# Patient Record
Sex: Male | Born: 1980 | Race: Black or African American | Hispanic: No | Marital: Married | Smoking: Current some day smoker
Health system: Southern US, Community
[De-identification: ages and names within clinical notes are randomized; demographics above are authoritative.]

## PROBLEM LIST (undated history)

## (undated) DIAGNOSIS — I319 Disease of pericardium, unspecified: Secondary | ICD-10-CM

## (undated) DIAGNOSIS — R011 Cardiac murmur, unspecified: Secondary | ICD-10-CM

## (undated) HISTORY — DX: Cardiac murmur, unspecified: R01.1

---
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
## 2001-01-25 ENCOUNTER — Emergency Department (HOSPITAL_COMMUNITY): Admission: EM | Admit: 2001-01-25 | Discharge: 2001-01-25 | Payer: Self-pay | Admitting: Emergency Medicine

## 2001-07-10 ENCOUNTER — Encounter: Payer: Self-pay | Admitting: Orthopaedic Surgery

## 2001-07-10 ENCOUNTER — Ambulatory Visit (HOSPITAL_COMMUNITY): Admission: RE | Admit: 2001-07-10 | Discharge: 2001-07-10 | Payer: Self-pay | Admitting: Orthopaedic Surgery

## 2002-01-22 ENCOUNTER — Encounter: Payer: Self-pay | Admitting: Neurological Surgery

## 2002-01-22 ENCOUNTER — Encounter: Admission: RE | Admit: 2002-01-22 | Discharge: 2002-01-22 | Payer: Self-pay | Admitting: Neurological Surgery

## 2002-02-14 ENCOUNTER — Encounter: Admission: RE | Admit: 2002-02-14 | Discharge: 2002-02-14 | Payer: Self-pay | Admitting: Neurological Surgery

## 2002-02-14 ENCOUNTER — Encounter: Payer: Self-pay | Admitting: Neurological Surgery

## 2004-01-13 ENCOUNTER — Emergency Department (HOSPITAL_COMMUNITY): Admission: EM | Admit: 2004-01-13 | Discharge: 2004-01-13 | Payer: Self-pay | Admitting: Family Medicine

## 2004-07-23 ENCOUNTER — Ambulatory Visit: Payer: Self-pay | Admitting: Pulmonary Disease

## 2005-06-20 HISTORY — PX: KNEE LIGAMENT RECONSTRUCTION: SHX1895

## 2005-08-28 ENCOUNTER — Emergency Department (HOSPITAL_COMMUNITY): Admission: EM | Admit: 2005-08-28 | Discharge: 2005-08-28 | Payer: Self-pay | Admitting: Emergency Medicine

## 2005-08-30 ENCOUNTER — Emergency Department (HOSPITAL_COMMUNITY): Admission: EM | Admit: 2005-08-30 | Discharge: 2005-08-30 | Payer: Self-pay | Admitting: Emergency Medicine

## 2005-10-27 ENCOUNTER — Ambulatory Visit (HOSPITAL_BASED_OUTPATIENT_CLINIC_OR_DEPARTMENT_OTHER): Admission: RE | Admit: 2005-10-27 | Discharge: 2005-10-28 | Payer: Self-pay | Admitting: Specialist

## 2006-08-16 ENCOUNTER — Ambulatory Visit: Payer: Self-pay | Admitting: Pulmonary Disease

## 2006-08-17 IMAGING — CT CT ABDOMEN W/ CM
2 of 4 series · 14 of 32 positions shown, 19 images · IV contrast ([ID] OMNI 300)
Comparison: None.

CLINICAL DATA: Progressive right lower quadrant pain and fever for three days.  Question appendicitis. 
 ABDOMEN CT WITH CONTRAST:
TECHNIQUE: Multidetector CT imaging of the abdomen was performed following the standard protocol during bolus administration of intravenous contrast.
 Contrast:  100 cc Omnipaque 300.  Oral contrast was given.
TECHNIQUE: Multidetector CT imaging of the pelvis was performed following the standard protocol during bolus administration of intravenous contrast.

[Series 2: abd pelvis · axial · 0.74mm/px · z∈[-493,-138]mm · 7 of 95 slices shown, 12 images]
[im 12/95  soft-tissue]
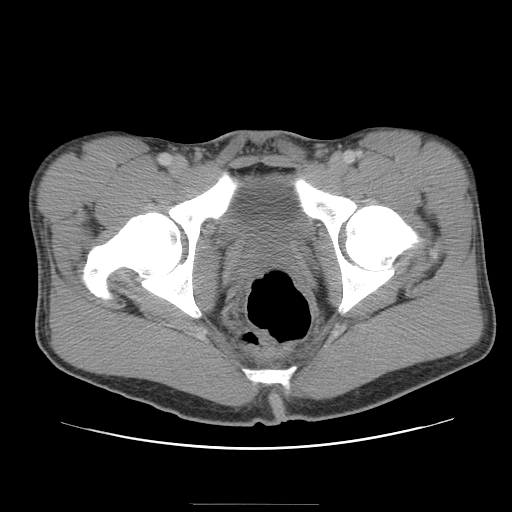
[im 12/95  bone]
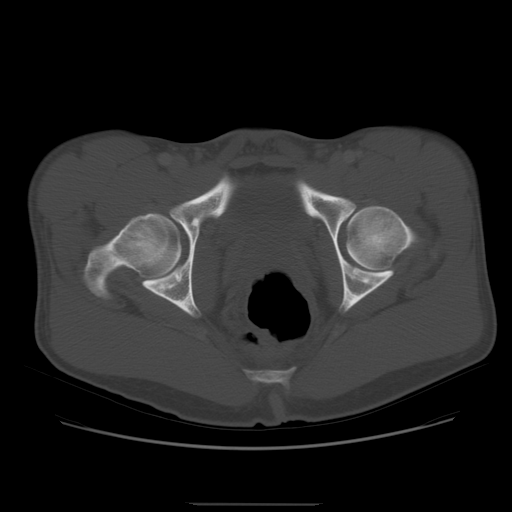
[im 24/95  soft-tissue]
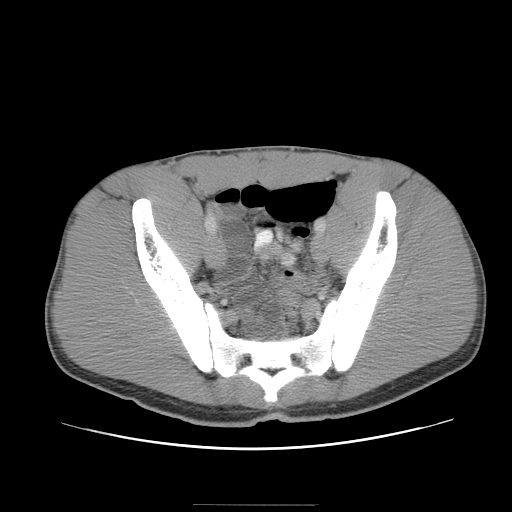
[im 36/95  soft-tissue]
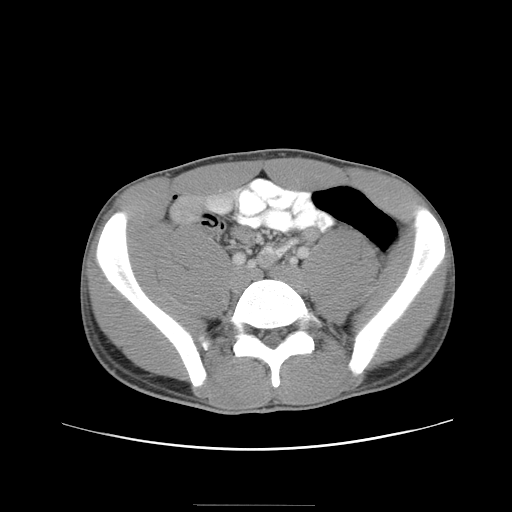
[im 48/95  soft-tissue]
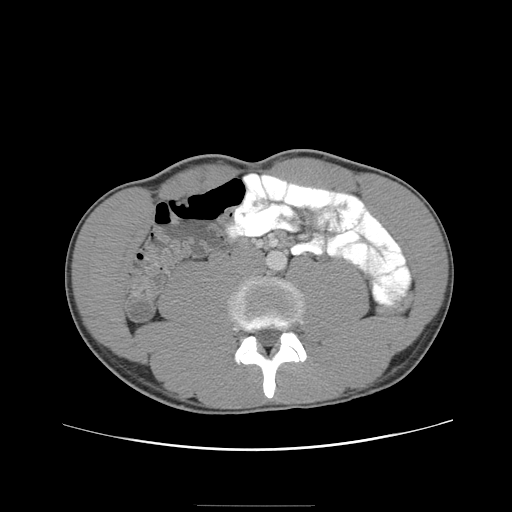
[im 48/95  lung]
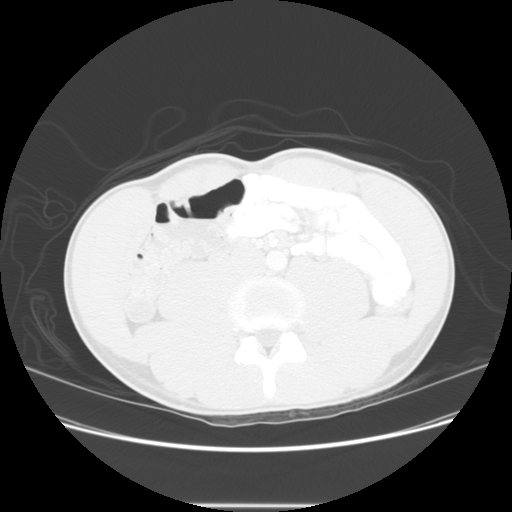
[im 59/95  soft-tissue]
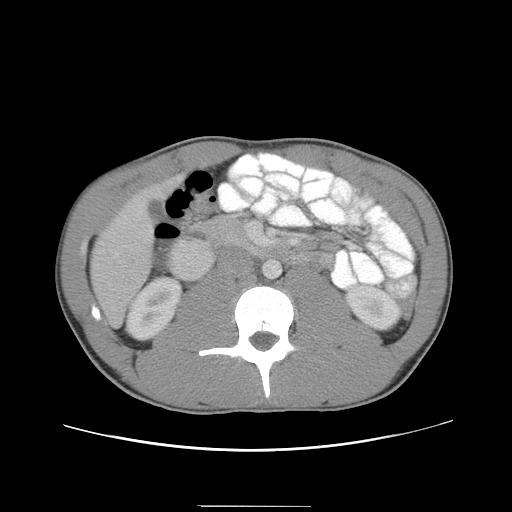
[im 59/95  lung]
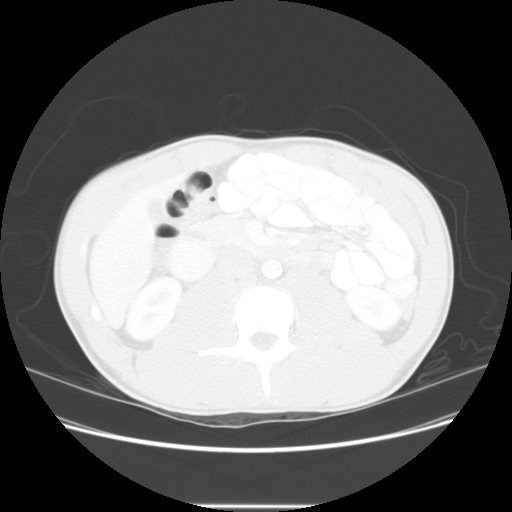
[im 71/95  soft-tissue]
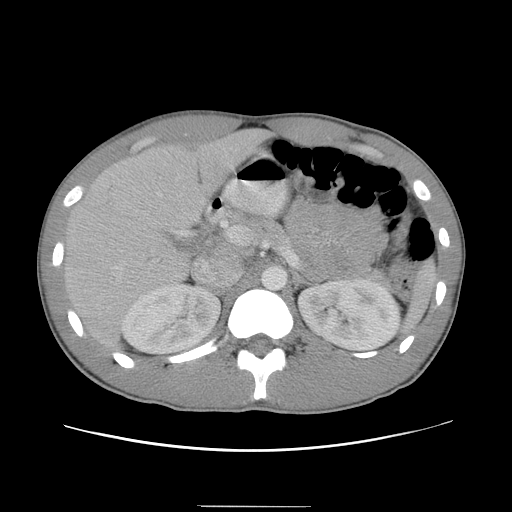
[im 71/95  lung]
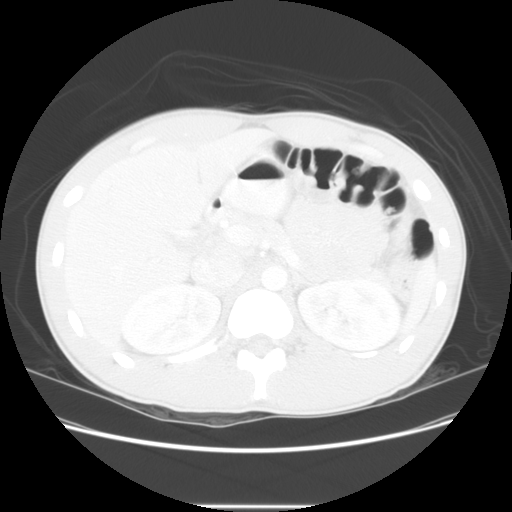
[im 83/95  soft-tissue]
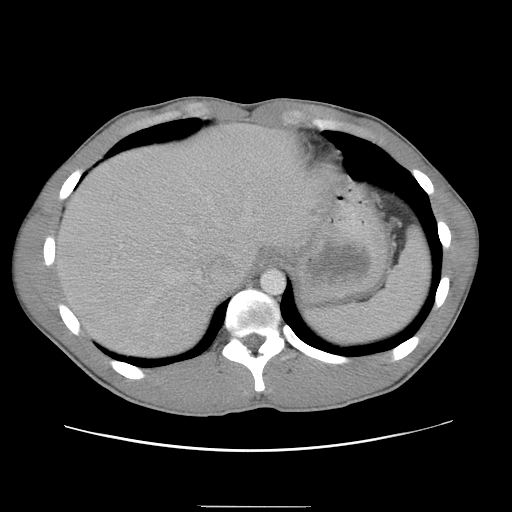
[im 83/95  lung]
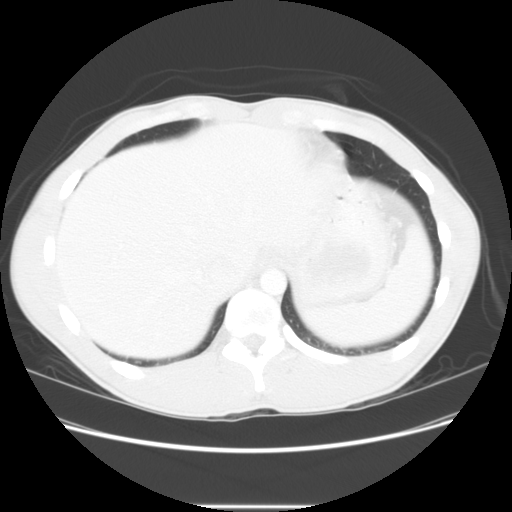

[Series 104: reformatted · sagittal · 0.74mm/px · 7 of 130 slices shown]
[im 13/130  soft-tissue]
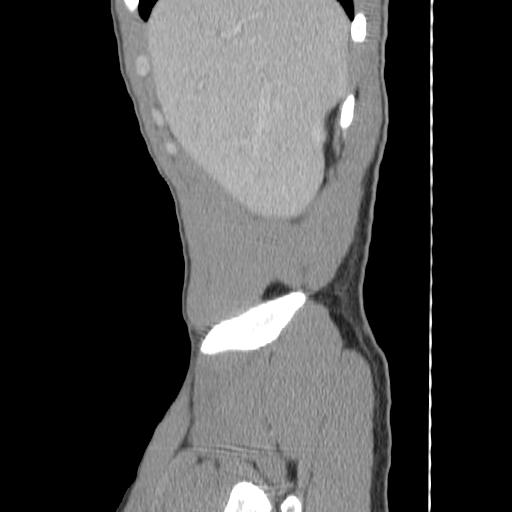
[im 26/130  soft-tissue]
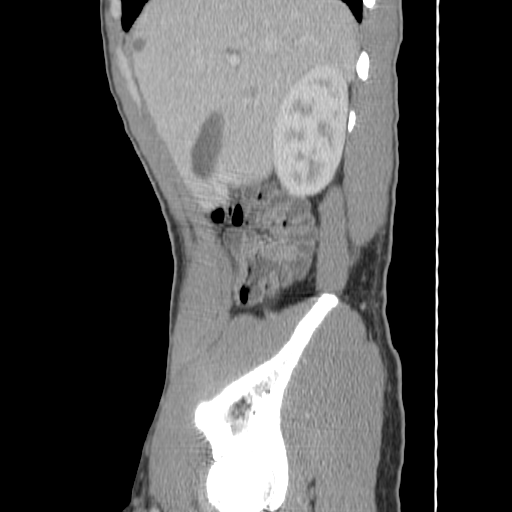
[im 39/130  soft-tissue]
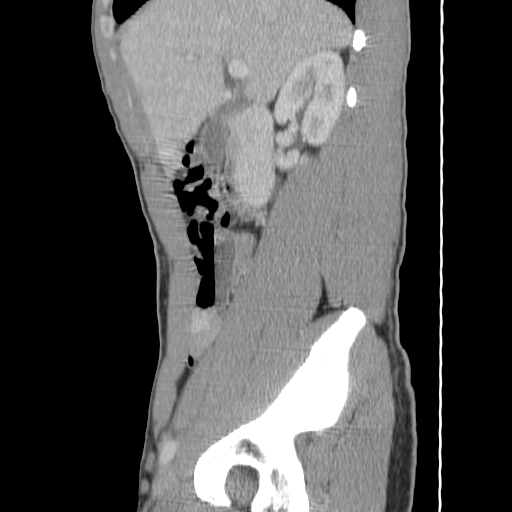
[im 52/130  soft-tissue]
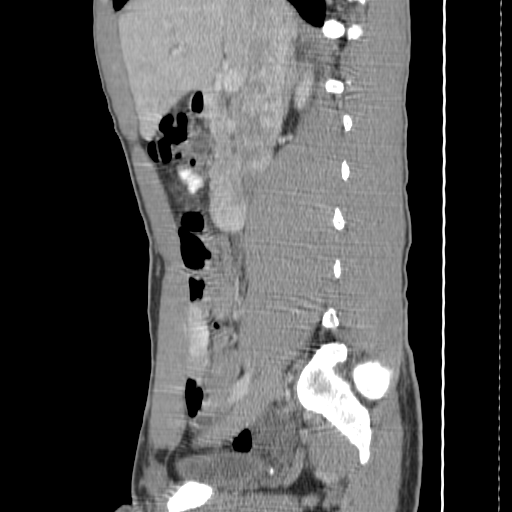
[im 78/130  soft-tissue]
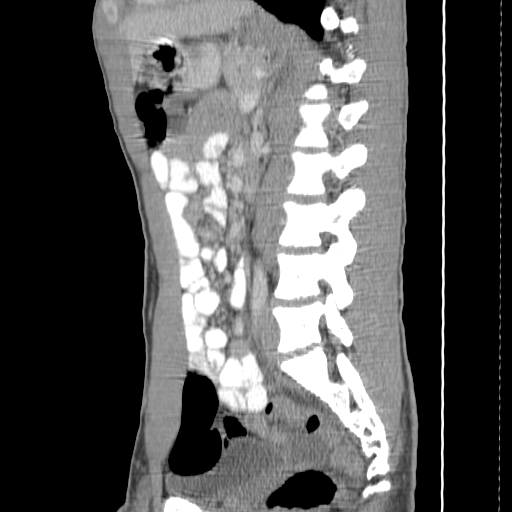
[im 91/130  soft-tissue]
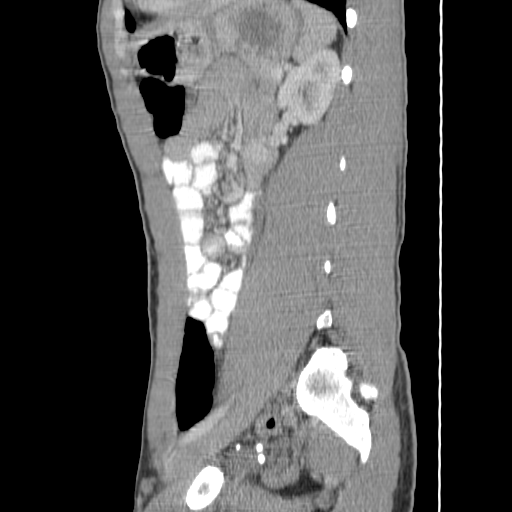
[im 104/130  soft-tissue]
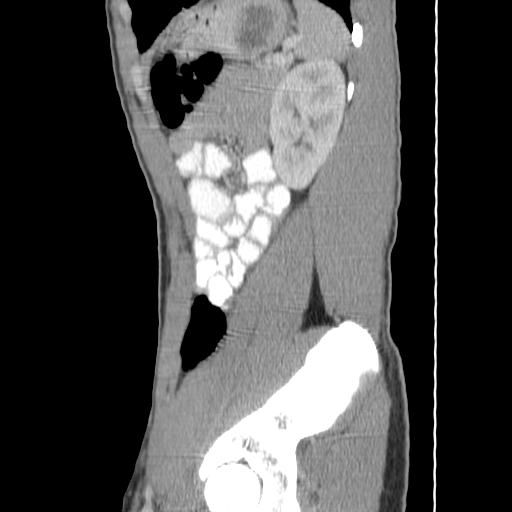

[14 of 32 positions shown; findings below may reference images not displayed]

FINDINGS: The lung bases are clear.  There is no pleural effusion.  A probable septated 1.9 x 0.8 cm cyst is noted anteriorly in the left hepatic lobe on image 17.  There is hepatic focal fat adjacent to the falciform ligament.  The liver otherwise appears normal.  The spleen, gallbladder, pancreas, adrenal glands, and kidneys appear normal.  No intraabdominal inflammatory changes are seen.
IMPRESSION: No acute abdominal findings.  Probable hepatic cyst.
 PELVIS CT WITH CONTRAST:
FINDINGS: The oral contrast has not yet filled the right colon.  However, a normal caliber appendix is visualized on image 58 through 61 without surrounding inflammation.  The appendiceal lumen is filled with air.  No pelvic inflammatory changes are identified.  There are bilateral pelvic phleboliths.
IMPRESSION: No CT evidence of acute appendicitis or other acute pelvic process.

## 2011-09-30 ENCOUNTER — Emergency Department (INDEPENDENT_AMBULATORY_CARE_PROVIDER_SITE_OTHER): Payer: 59

## 2011-09-30 ENCOUNTER — Encounter (HOSPITAL_BASED_OUTPATIENT_CLINIC_OR_DEPARTMENT_OTHER): Payer: Self-pay | Admitting: Family Medicine

## 2011-09-30 ENCOUNTER — Emergency Department (HOSPITAL_BASED_OUTPATIENT_CLINIC_OR_DEPARTMENT_OTHER)
Admission: EM | Admit: 2011-09-30 | Discharge: 2011-09-30 | Disposition: A | Discharge status: Home / Self Care | Payer: 59 | Attending: Emergency Medicine | Admitting: Emergency Medicine

## 2011-09-30 DIAGNOSIS — I319 Disease of pericardium, unspecified: Secondary | ICD-10-CM

## 2011-09-30 DIAGNOSIS — J45909 Unspecified asthma, uncomplicated: Secondary | ICD-10-CM | POA: Insufficient documentation

## 2011-09-30 DIAGNOSIS — R079 Chest pain, unspecified: Secondary | ICD-10-CM

## 2011-09-30 DIAGNOSIS — F17200 Nicotine dependence, unspecified, uncomplicated: Secondary | ICD-10-CM | POA: Insufficient documentation

## 2011-09-30 LAB — RAPID URINE DRUG SCREEN, HOSP PERFORMED
Barbiturates: NOT DETECTED
Benzodiazepines: NOT DETECTED
Cocaine: NOT DETECTED
Opiates: NOT DETECTED
Tetrahydrocannabinol: POSITIVE — AB

## 2011-09-30 LAB — DIFFERENTIAL
Basophils Absolute: 0 10*3/uL (ref 0.0–0.1)
Lymphocytes Relative: 21 % (ref 12–46)
Lymphs Abs: 2.4 10*3/uL (ref 0.7–4.0)
Neutro Abs: 7.6 10*3/uL (ref 1.7–7.7)
Neutrophils Relative %: 67 % (ref 43–77)

## 2011-09-30 LAB — CBC
Platelets: 311 10*3/uL (ref 150–400)
RBC: 4.82 MIL/uL (ref 4.22–5.81)
RDW: 12.5 % (ref 11.5–15.5)
WBC: 11.4 10*3/uL — ABNORMAL HIGH (ref 4.0–10.5)

## 2011-09-30 LAB — BASIC METABOLIC PANEL
CO2: 26 mEq/L (ref 19–32)
Calcium: 9.7 mg/dL (ref 8.4–10.5)
Glucose, Bld: 118 mg/dL — ABNORMAL HIGH (ref 70–99)
Potassium: 3.7 mEq/L (ref 3.5–5.1)
Sodium: 138 mEq/L (ref 135–145)

## 2011-09-30 MED ORDER — IBUPROFEN 800 MG PO TABS
800.0000 mg | ORAL_TABLET | Freq: Once | ORAL | Status: AC
  Administered 2011-09-30: 800 mg via ORAL
  Filled 2011-09-30: qty 1

## 2011-09-30 MED ORDER — IBUPROFEN 800 MG PO TABS: 800.0000 mg | ORAL_TABLET | Freq: Three times a day (TID) | ORAL | Status: AC

## 2011-09-30 MED ORDER — GI COCKTAIL ~~LOC~~
30.0000 mL | Freq: Once | ORAL | Status: AC
  Administered 2011-09-30: 30 mL via ORAL
  Filled 2011-09-30: qty 30

## 2011-09-30 NOTE — ED Provider Notes (Signed)
History     CSN: [id number]  Arrival date & time 09/30/11  0847   First MD Initiated Contact with Patient 09/30/11 [phone]      Chief Complaint  Patient presents with  . Chest Pain    (Consider location/radiation/quality/duration/timing/severity/associated sxs/prior treatment) HPI Comments: Patient presents with left-sided chest tightness that has been constant since about 12 AM. Not had this pain in the past. Trauma. Pain in his left chest wall, worse with palpation and worse with movement. Associated with some shortness of breath. He denies any nausea, vomiting, abdominal pain, back pain. He denies any cardiac history. He took some Gas-X without relief. The pain does not radiate and is persistent moderate in intensity. Denies any cocaine abuse.  The history is provided by the patient and a relative.    Past Medical History  Diagnosis Date  . Asthma     Past Surgical History  Procedure Date  . Knee arthroscopy     No family history on file.  History  Substance Use Topics  . Smoking status: Current Some Day Smoker  . Smokeless tobacco: Not on file  . Alcohol Use: Yes      Review of Systems  Constitutional: Negative for fever, activity change and appetite change.  HENT: Negative for congestion and rhinorrhea.   Respiratory: Positive for chest tightness and shortness of breath. Negative for cough.   Cardiovascular: Positive for chest pain.  Gastrointestinal: Negative for nausea, vomiting and abdominal pain.  Genitourinary: Negative for dysuria and hematuria.  Musculoskeletal: Negative for back pain.  Neurological: Negative for dizziness, weakness and headaches.    Allergies  Review of patient's allergies indicates no known allergies.  Home Medications   Current Outpatient Rx  Name Route Sig Dispense Refill  . IBUPROFEN 800 MG PO TABS Oral Take 1 tablet (800 mg total) by mouth 3 (three) times daily. 21 tablet 0    BP 137/78  Pulse 68  Temp(Src) 97.8 F (36.6  C) (Oral)  Resp 16  Ht 6' (1.829 m)  Wt 215 lb (97.523 kg)  BMI 29.16 kg/m2  SpO2 100%  Physical Exam  Constitutional: He is oriented to person, place, and time. He appears well-developed and well-nourished. No distress.  HENT:  Head: Normocephalic and atraumatic.  Mouth/Throat: Oropharynx is clear and moist. No oropharyngeal exudate.  Eyes: Conjunctivae are normal. Pupils are equal, round, and reactive to light.  Neck: Normal range of motion. Neck supple.  Cardiovascular: Normal rate, regular rhythm and normal heart sounds.   No murmur heard. Pulmonary/Chest: Effort normal and breath sounds normal. No respiratory distress. He exhibits tenderness.       Left-sided chest wall tenderness, no rash  Abdominal: Soft. There is no tenderness. There is no rebound and no guarding.  Musculoskeletal: Normal range of motion. He exhibits no edema and no tenderness.  Neurological: He is alert and oriented to person, place, and time. No cranial nerve deficit.  Skin: Skin is warm.    ED Course  Procedures (including critical care time)  Labs Reviewed  CBC - Abnormal; Notable for the following:    WBC 11.4 (*)    MCHC 36.1 (*)    All other components within normal limits  DIFFERENTIAL - Abnormal; Notable for the following:    Eosinophils Relative 6 (*)    All other components within normal limits  BASIC METABOLIC PANEL - Abnormal; Notable for the following:    Glucose, Bld 118 (*)    GFR calc non Af Amer 89 (*)  All other components within normal limits  URINE RAPID DRUG SCREEN (HOSP PERFORMED) - Abnormal; Notable for the following:    Tetrahydrocannabinol POSITIVE (*) REPEATED TO VERIFY   All other components within normal limits  TROPONIN I   Dg Chest 2 View  09/30/2011  *RADIOLOGY REPORT*  Clinical Data: Chest pain.  CHEST - 2 VIEW  Comparison: None  Findings: Heart and mediastinal contours are within normal limits. No focal opacities or effusions.  No acute bony abnormality.   IMPRESSION: No active cardiopulmonary disease.  Original Report Authenticated By: Cyndie Chime, M.D.     1. Pericarditis       MDM  Constant chest wall pain since 12 AM. Worse with movement. Vital signs stable, no distress. Very low suspicion for ACS.  Cocaine screen negative. Chest x-ray negative. Treating chest wall pain and pericarditis with anti-inflammatories.   Date: 09/30/2011  Rate: 63  Rhythm: normal sinus rhythm  QRS Axis: normal  Intervals: normal  ST/T Wave abnormalities: early repolarization  Conduction Disutrbances:none  Narrative Interpretation: some PR depression, pericarditis changes  Old EKG Reviewed: none available          Glynn Octave, MD 09/30/11 1034

## 2011-09-30 NOTE — Discharge Instructions (Signed)
Pericarditis Pericarditis is an inflammation of the sac that surrounds the heart. This sac is known as the pericardium. Typically the pericardium contains a smooth lubricating lining. When you have pericarditis, the lining is more like sandpaper. CAUSES   Viral or bacterial infections.   Heart disease.   Heart surgery.   Reaction to a drug.   Kidney failure.   Thyroid problems.   Arthritis.   Cancer.  SYMPTOMS   Sharp chest pain under the breast bone (sternum).   Pain may also be felt in the neck, back or arms.   Pain may feel worse if you lean forward, take a deep breath, or lie down.   Shortness of breath.   Fever.   Pain on swallowing.  DIAGNOSIS  The diagnosis of pericarditis is based on your history,exam findings, and tests. These may include an EKG, chest x-ray, CT studies, blood tests, and echocardiogram.  TREATMENT  Treatment for pericarditis includes medicine for discomfort. An antibiotic may also be needed. With proper treatment, most episodes of pericarditis get better without complications. HOME CARE INSTRUCTIONS   Get plenty of rest.   Avoid activities that increase your pain.   Do not smoke or drink alcohol.   Be sure to see your caregiver as recommended for follow-up to make sure your condition resolves completely.  SEEK IMMEDIATE MEDICAL CARE IF:  You develop pain or shortness of breath that is getting worse.   You develop a high fever and severe abdominal or back pain.   You develop marked weakness, fainting, or any other serious complaint.  Document Released: 07/14/2004 Document Revised: 05/26/2011 Document Reviewed: 09/03/2007 [facility] Patient Information 2012 [place], [place].

## 2011-09-30 NOTE — ED Notes (Signed)
Pt sts he woke up with chest tightness at 2am. Pt sts movement such as riding in car makes symptom worse. Pt also c/o nausea last night and sts he "thinks it could be indigestion but not sure". Pt sts he had murmur as child and exercise induced asthma, denies other history.

## 2011-10-03 ENCOUNTER — Telehealth: Payer: Self-pay | Admitting: Pulmonary Disease

## 2011-10-03 NOTE — Telephone Encounter (Signed)
I spoke with the pt and he states he was seen in ER over the weekend and was instructed to f/u with Dr. Kriste Basque. Pt has not been seen in over 5 years, no record in centricity. I advised that after 3 years he is considered a new patient, and that Dr. Kriste Basque is not accepting new pt. Pt request I send message to ask if he will accept him back as patient. Please advise.Carron Curie, CMA

## 2011-10-03 NOTE — Telephone Encounter (Signed)
Called patient after Dr.Nadel's office called to let us know he needed a pcp.  The patient stated he needs a cardiologist and an antibiotic to fix his "heart problem".  He was advised the next new pt appt we had is May 28th with Dr.Jones.  He refused that apt stating it was not soon enough.  The patient was advised to call Dr.Nadel's office back to see if they could refer him to a cardiologist due to him stating he needed a cardiologist.

## 2011-10-03 NOTE — Telephone Encounter (Signed)
Per SN---ok to add pt to next aval appt.  Scheduled pt appt to see SN on 4-29 at 12.  Pt is aware.  Pt was advised to bring in any meds that he is currently taking.

## 2011-10-03 NOTE — Telephone Encounter (Signed)
Pt's mother , Pj Zehner [phone]), called in & stated she has been a pt of SN for quite a long time as well.  Pt is requesting to be seen by SN ASAP w/ a follow up appt.  Stated that pt has "air around his heart."  Please call pt back, but if necessary, Mrs. Bley can be contacted as well w/ any questions on pt's behalf.  Antionette Fairy

## 2011-10-03 NOTE — Telephone Encounter (Signed)
Pt called back requesting an appt w/ sn asap. Troy Erickson

## 2011-10-05 ENCOUNTER — Emergency Department (HOSPITAL_COMMUNITY)
Admission: EM | Admit: 2011-10-05 | Discharge: 2011-10-05 | Disposition: A | Discharge status: Home / Self Care | Payer: 59 | Source: Home / Self Care | Attending: Emergency Medicine | Admitting: Emergency Medicine

## 2011-10-05 ENCOUNTER — Encounter (HOSPITAL_COMMUNITY): Payer: Self-pay

## 2011-10-05 DIAGNOSIS — I309 Acute pericarditis, unspecified: Secondary | ICD-10-CM

## 2011-10-05 HISTORY — DX: Disease of pericardium, unspecified: I31.9

## 2011-10-05 MED ORDER — NAPROXEN 500 MG PO TABS: 500.0000 mg | ORAL_TABLET | Freq: Two times a day (BID) | ORAL | Status: AC

## 2011-10-05 MED ORDER — ALBUTEROL SULFATE HFA 108 (90 BASE) MCG/ACT IN AERS: 1.0000 | INHALATION_SPRAY | Freq: Four times a day (QID) | RESPIRATORY_TRACT | Status: AC | PRN

## 2011-10-05 NOTE — Discharge Instructions (Signed)
Pericarditis   Pericarditis is an inflammation (soreness or redness) of the sac (almost like a bag) surrounding the heart, starting at the large vessels at the top of the heart, shown in the picture, and wrapping down around the heart. The inside of this sac is very smooth so the heart can beat and slide easily within this protective membrane. The outside of the sac is a tougher layer of material. This sac may become inflamed by a number of different problems. Pericarditis is usually accompanied by pain in the chest which radiates to the shoulder, back, and upper and middle belly (abdomen). There may be shortness of breath and swelling of the abdomen. Sometimes fluid collects around the heart. When this happens the heart cannot beat as well. When your heart is unable to work as well, you will not feel well. You have may have shortness of breath (dyspnea) with exertion such as climbing stairs. The kidneys do not work as well so you may retain fluid. This is also one of the reasons your lower legs and ankles swell.   The first sign you will usually recognize is chest pain. You will also usually get a rapid heartbeat. You may have sudden unexplained weight gain of ten to fifteen pounds. You may get short of breath while sleeping. The heart actually has to work harder while you are lying down. This may also produce a night cough. It may help to sleep with two or more pillows.   With treatment these symptoms usually improve rapidly. Upon discharge from this location, weigh yourself after arriving home. Record your weight daily at the same time. This will give some indication as to your progress. As you get better your weight will usually go down. Follow a low sodium diet.   CAUSES   Medicine side effects.   Radiation as received for treatment of cancer.   Tumors or cancer themselves.   Infections. These may be caused by viruses, germs (bacteria) or fungi.   Tuberculosis (this is also an infection).   Autoimmune disorders  such as lupus, scleroderma, and rheumatoid arthritis.   DIAGNOSIS   The diagnosis of pericarditis is made by history (asking the patient what seems to be the problem) and physical exam (looking and listening to the patient by the caregiver). Blood tests may need to be done. Often specialized tests such as echocardiogram (pictures which are taken by bouncing sound waves off the heart), CT scans of the heart and chest, and perhaps other tests depending on what is happening, may be done.   TREATMENT   Treatment of pericarditis will usually include medicine to relieve pain.   If water retention is present, water reducing pills (diuretics) may be given to get rid of the water accumulation.   If the pericarditis is caused by an infection which can be treated, medicine which kill germs (antibiotics) will be started. If the infection is caused by a fungus or tuberculosis, treatment may be necessary for very long times.   If the infection is caused by a virus it will usually run its course and cause no further problems. When a virus is the cause, anti-inflammatory medicines (medicine against soreness) will often be given.   If complications occur from pericarditis, such as scarring of the pericardial sac following the inflammation, surgery is rarely needed to remove the sac. The sac surrounding the heart is not necessary for life. When it becomes scarred, it makes it more difficult for the heart to beat normally.     HOME CARE INSTRUCTIONS   Follow the treatment plan your caregiver prescribes. They can help you with your post hospital care program. Follow your caregiver's advice regarding dieting and exercise. Eat a low fat diet. Use alcohol only as directed. Take all medicines as directed.   Eat a nutritious diet low in fat and sodium.   Try to maintain an ideal weight. Your caregivers can help you with this.   Exercise as instructed.   Wear a medical alert bracelet if recommended by your caregiver.   Keep medicine with you,  including a list with dosages, in case of an emergency.   Quit smoking, if you smoke.   SEEK IMMEDIATE MEDICAL CARE IF:   Chest pain.   Vomiting.   Sweating (diaphoresis).   Irregular heartbeat (palpitations).   Racing heart.   Fainting episodes.   Feeling sick to your stomach (nausea).   Weakness.   If you develop any of the symptoms which originally made you seek care, call for local emergency medical help. Do not drive yourself to the hospital.   Document Released: 11/30/2000 Document Revised: 05/26/2011 Document Reviewed: 06/08/2011   [employer]® Patient Information ©2012 [employer].

## 2011-10-05 NOTE — ED Notes (Addendum)
Was advised by med center [facility] after vist dx of pericarditis to f/u with his MD; he has not been in for a while , and was told he would be treated as a new pt; has appt 4-26 next available appt. Little tightness in chest , hurts to take a big breath (1-2) pain scale; minimal chest pain w direct palpation of chest ; pain worse at night and in AM, wakes up w pain ; taking 800 mg motrin 3x day; NAD at present States pain is some improved ,but still having pain

## 2011-10-05 NOTE — ED Provider Notes (Signed)
Chief Complaint  Patient presents with  . Chest Pain    History of Present Illness:   Troy Erickson is a 31 year old male who has had a seven-day history of left pectoral chest pain without radiation. Prior to this he felt a little short of breath for about 2 weeks. The pain is pleuritic and sharp, worse if he lies flat or leans forward or takes a deep breath. He's felt somewhat nauseated, had a slight sore throat and cough. One day after this began, he went to [facility]. Workup there included chest x-ray, EKG, and blood work. His EKG showed diffuse ST segment elevation in all leads. He was diagnosed with pericarditis and was put on ibuprofen. His pain was better, now rated 3-5/10 in intensity. He denies any shortness of breath, coughing, or wheezing. He's had no palpitations, dizziness, or presyncope. He was told to followup with his primary care physician, Dr.  Kriste Basque, but he wasn't able to see him for about 2 weeks, so he came here. He has no prior cardiac history.  Review of Systems:  Other than noted above, the patient denies any of the following symptoms. Systemic:  No fever, chills, sweats, or fatigue. ENT:  No nasal congestion, rhinorrhea, or sore throat. Pulmonary:  No cough, wheezing, shortness of breath, sputum production, hemoptysis. Cardiac:  No palpitations, rapid heartbeat, dizziness, presyncope or syncope. GI:  No abdominal pain, heartburn, nausea, or vomiting. Skin:  No rash or itching. Ext:  No leg pain or swelling.   PMFSH:  Past medical history, family history, social history, meds, and allergies were reviewed and updated as needed.  Physical Exam:   Vital signs:  BP 136/92  Pulse 61  Temp(Src) 98.5 F (36.9 C) (Oral)  Resp 18  SpO2 97% Gen:  Alert, oriented, in no distress, skin warm and dry. Eye:  PERRL, lids and conjunctivas normal.  Sclera non-icteric. ENT:  Mucous membranes moist, pharynx clear. Neck:  Supple, no adenopathy or tenderness.  No  JVD. Lungs:  Clear to auscultation, no wheezes, rales or rhonchi.  No respiratory distress. Heart:  Regular rhythm.  No gallops, murmers, clicks or rubs. Chest:  No chest wall tenderness. Abdomen:  Soft, nontender, no organomegaly or mass.  Bowel sounds normal.  No pulsatile abdominal mass or bruit. Ext:  No edema.  No calf tenderness and Homann's sign negative.  Pulses full and equal. Skin:  Warm and dry.  No rash.   EKG:   Date: 10/05/2011  Rate:   Rhythm: normal sinus rhythm  QRS Axis: normal  Intervals: normal  ST/T Wave abnormalities: ST elevations diffusely  Conduction Disutrbances:none  Narrative Interpretation: Diffuse ST elevations consistent with pericarditis.  Old EKG Reviewed: No change since previous EKG  Assessment:  The encounter diagnosis was Acute pericarditis.   Plan:   1.  The following meds were prescribed:   New Prescriptions   ALBUTEROL (PROVENTIL HFA;VENTOLIN HFA) 108 (90 BASE) MCG/ACT INHALER    Inhale 1-2 puffs into the lungs every 6 (six) hours as needed for wheezing.   NAPROXEN (NAPROSYN) 500 MG TABLET    Take 1 tablet (500 mg total) by mouth 2 (two) times daily.   2.  The patient was instructed in symptomatic care and handouts were given. 3.  The patient was told to return if becoming worse in any way, if no better in 3 or 4 days, and given some red flag symptoms that would indicate earlier return.  Follow up:  The patient was  told to follow up with [facility] tomorrow at 10:30 AM. I called their office and made an appointment.   Reuben Likes, MD 10/05/11 [phone]

## 2011-10-06 ENCOUNTER — Ambulatory Visit (HOSPITAL_COMMUNITY): Payer: 59 | Attending: Cardiology

## 2011-10-06 ENCOUNTER — Encounter: Payer: Self-pay | Admitting: Cardiovascular Disease

## 2011-10-06 ENCOUNTER — Encounter: Payer: Self-pay | Admitting: *Deleted

## 2011-10-06 ENCOUNTER — Ambulatory Visit (INDEPENDENT_AMBULATORY_CARE_PROVIDER_SITE_OTHER): Payer: 59 | Admitting: Cardiovascular Disease

## 2011-10-06 ENCOUNTER — Other Ambulatory Visit: Payer: Self-pay

## 2011-10-06 DIAGNOSIS — I319 Disease of pericardium, unspecified: Secondary | ICD-10-CM | POA: Insufficient documentation

## 2011-10-06 DIAGNOSIS — R0602 Shortness of breath: Secondary | ICD-10-CM

## 2011-10-06 DIAGNOSIS — R0609 Other forms of dyspnea: Secondary | ICD-10-CM | POA: Insufficient documentation

## 2011-10-06 DIAGNOSIS — R079 Chest pain, unspecified: Secondary | ICD-10-CM

## 2011-10-06 DIAGNOSIS — R0989 Other specified symptoms and signs involving the circulatory and respiratory systems: Secondary | ICD-10-CM | POA: Insufficient documentation

## 2011-10-06 DIAGNOSIS — R072 Precordial pain: Secondary | ICD-10-CM | POA: Insufficient documentation

## 2011-10-06 DIAGNOSIS — I517 Cardiomegaly: Secondary | ICD-10-CM | POA: Insufficient documentation

## 2011-10-06 NOTE — Progress Notes (Signed)
Pt notified of echo results

## 2011-10-06 NOTE — Progress Notes (Signed)
   History of Present Illness: 31 yo AAM with history of asthma who is here today for evaluation of chest pains. He was seen in the ED on 09/30/11 for constant, left sided chest pain. This seems to be worsened with deep breaths, leaning over and bending down. He was told in the past that he had a murmur and an enlarged heart. He describes constant chest pain for the last week. Pain is constant. EKG in ED with diffuse ST elevation felt to be secondary to pericarditis or early repolarization.   Primary Care Physician: Alroy Dust  Last Lipid Profile:   Past Medical History  Diagnosis Date  . Asthma   . Pericarditis   . Heart murmur     as a child    Past Surgical History  Procedure Date  . Knee arthroscopy 2007    left    Current Outpatient Prescriptions  Medication Sig Dispense Refill  . albuterol (PROVENTIL HFA;VENTOLIN HFA) 108 (90 BASE) MCG/ACT inhaler Inhale 1-2 puffs into the lungs every 6 (six) hours as needed for wheezing.  1 Inhaler  0  . ibuprofen (ADVIL,MOTRIN) 800 MG tablet Take 1 tablet (800 mg total) by mouth 3 (three) times daily.  21 tablet  0  . naproxen (NAPROSYN) 500 MG tablet Take 1 tablet (500 mg total) by mouth 2 (two) times daily.  30 tablet  0    No Known Allergies  History   Social History  . Marital Status: Single    Spouse Name: N/A    Number of Children: 0  . Years of Education: N/A   Occupational History  . [employer]   Social History Main Topics  . Smoking status: Current Some Day Smoker  . Smokeless tobacco: Not on file  . Alcohol Use: 1.8 oz/week    3 Cans of beer per week  . Drug Use: No  . Sexually Active: Not on file   Other Topics Concern  . Not on file   Social History Narrative  . No narrative on file    No family history on file.  Review of Systems:  As stated in the HPI and otherwise negative.   BP 118/76  Pulse 65  Ht 6' (1.829 m)  Wt 218 lb (98.884 kg)  BMI 29.57 kg/m2  Physical  Examination: General: Well developed, well nourished, NAD HEENT: OP clear, mucus membranes moist SKIN: warm, dry. No rashes. Neuro: No focal deficits Musculoskeletal: Muscle strength 5/5 all ext Psychiatric: Mood and affect normal Neck: No JVD, no carotid bruits, no thyromegaly, no lymphadenopathy. Lungs:Clear bilaterally, no wheezes, rhonci, crackles Cardiovascular: Regular rate and rhythm. No murmurs, gallops or rubs. Abdomen:Soft. Bowel sounds present. Non-tender.  Extremities: No lower extremity edema. Pulses are 2 + in the bilateral DP/PT.  EKG:

## 2011-10-06 NOTE — Assessment & Plan Note (Signed)
This is likely due to pericarditis. He will continue his Naprosyn as scheduled. Will get echo to exclude large effusion and assess LV size and function. Will arrange exercise treadmill stress test to exclude ischemia.

## 2011-10-06 NOTE — Patient Instructions (Signed)
Your physician has requested that you have an echocardiogram today at [facility]. Echocardiography is a painless test that uses sound waves to create images of your heart. It provides your doctor with information about the size and shape of your heart and how well your heart's chambers and valves are working. This procedure takes approximately one hour. There are no restrictions for this procedure.  Your physician has requested that you have an exercise tolerance test with Dr. Clifton James. For further information please visit [email or web address] Please also follow instruction sheet, as given.

## 2011-10-17 ENCOUNTER — Ambulatory Visit: Payer: Self-pay | Admitting: Pulmonary Disease

## 2011-10-17 ENCOUNTER — Ambulatory Visit (INDEPENDENT_AMBULATORY_CARE_PROVIDER_SITE_OTHER): Payer: 59 | Admitting: Cardiovascular Disease

## 2011-10-17 DIAGNOSIS — R079 Chest pain, unspecified: Secondary | ICD-10-CM

## 2011-10-17 NOTE — Progress Notes (Signed)
Exercise Treadmill Test  Pre-Exercise Testing Evaluation Rhythm: normal sinus  Rate: 68   PR:  .16 QRS:  .09  QT:  .38 QTc: .40           Test  Exercise Tolerance Test Ordering MD: Melene Muller, MD  Interpreting MD:  Melene Muller, MD  Unique Test No: 1  Treadmill:  2  Indication for ETT: chest pain - rule out ischemia  Contraindication to ETT: No   Stress Modality: exercise - treadmill  Cardiac Imaging Performed: non   Protocol: standard Bruce - maximal  Max BP:  151/95  Max MPHR (bpm):  191 85% MPR (bpm):  162  MPHR obtained (bpm):  171 % MPHR obtained: 90  Reached 85% MPHR (min:sec):  10:01 Total Exercise Time (min-sec):  90  Workload in METS:  11.7 Borg Scale: 15  Reason ETT Terminated:  fatigue    ST Segment Analysis At Rest: normal ST segments - no evidence of significant ST depression With Exercise: no evidence of significant ST depression  Other Information Arrhythmia:  No Angina during ETT:  absent (0) Quality of ETT:  non-diagnostic  ETT Interpretation:  normal - no evidence of ischemia by ST analysis  Comments: He exercised for 10 minutes and had no chest pain or EKG changes suggesting ischemia.   Recommendations: No further ischemic workup.

## 2011-11-03 ENCOUNTER — Encounter: Payer: 59 | Admitting: Cardiovascular Disease

## 2012-01-17 ENCOUNTER — Ambulatory Visit: Payer: Self-pay | Admitting: Pulmonary Disease

## 2012-09-16 IMAGING — CR DG CHEST 2V
2 series · 2 of 2 positions shown · non-contrast
Comparison: None

CLINICAL DATA: Chest pain.

CHEST - 2 VIEW

[w chest pa]
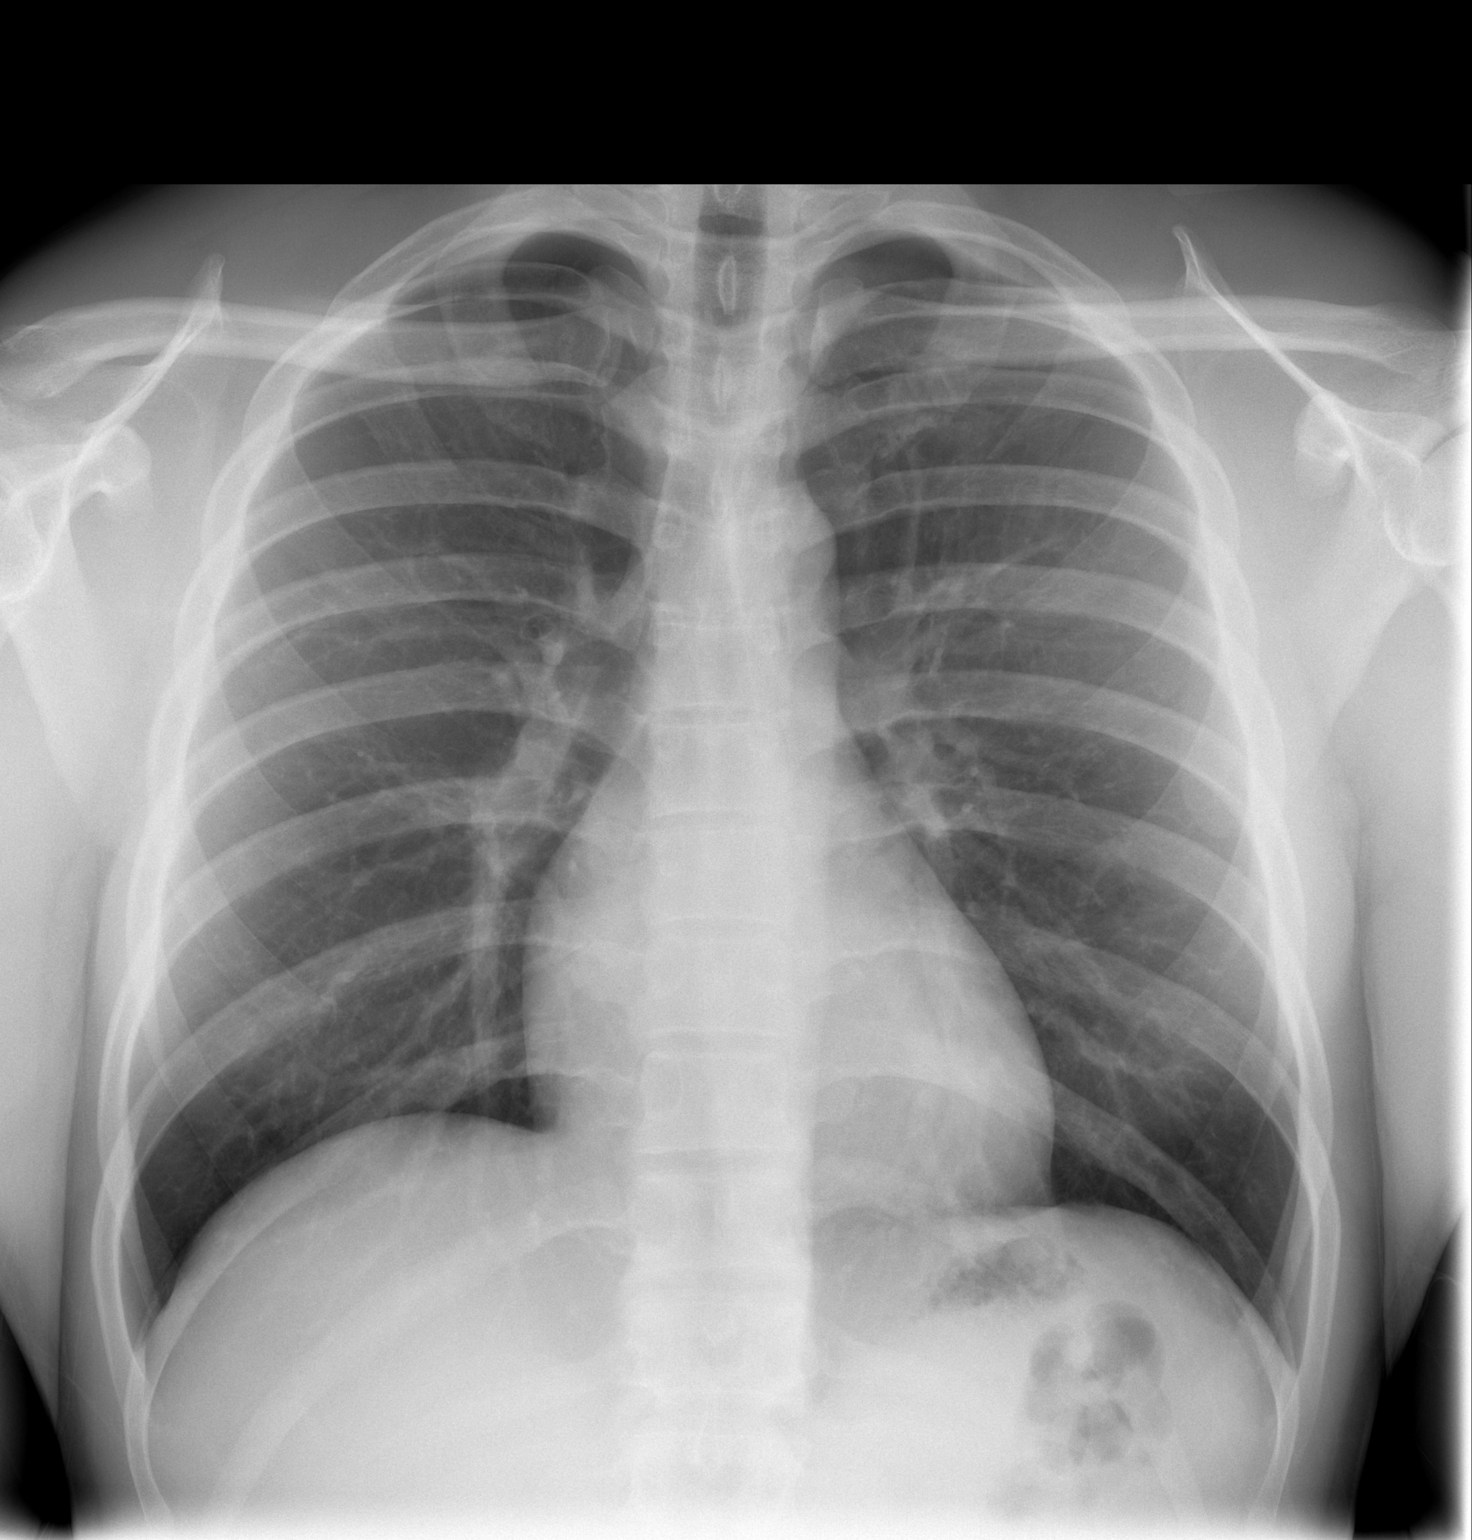

[w chest lat]
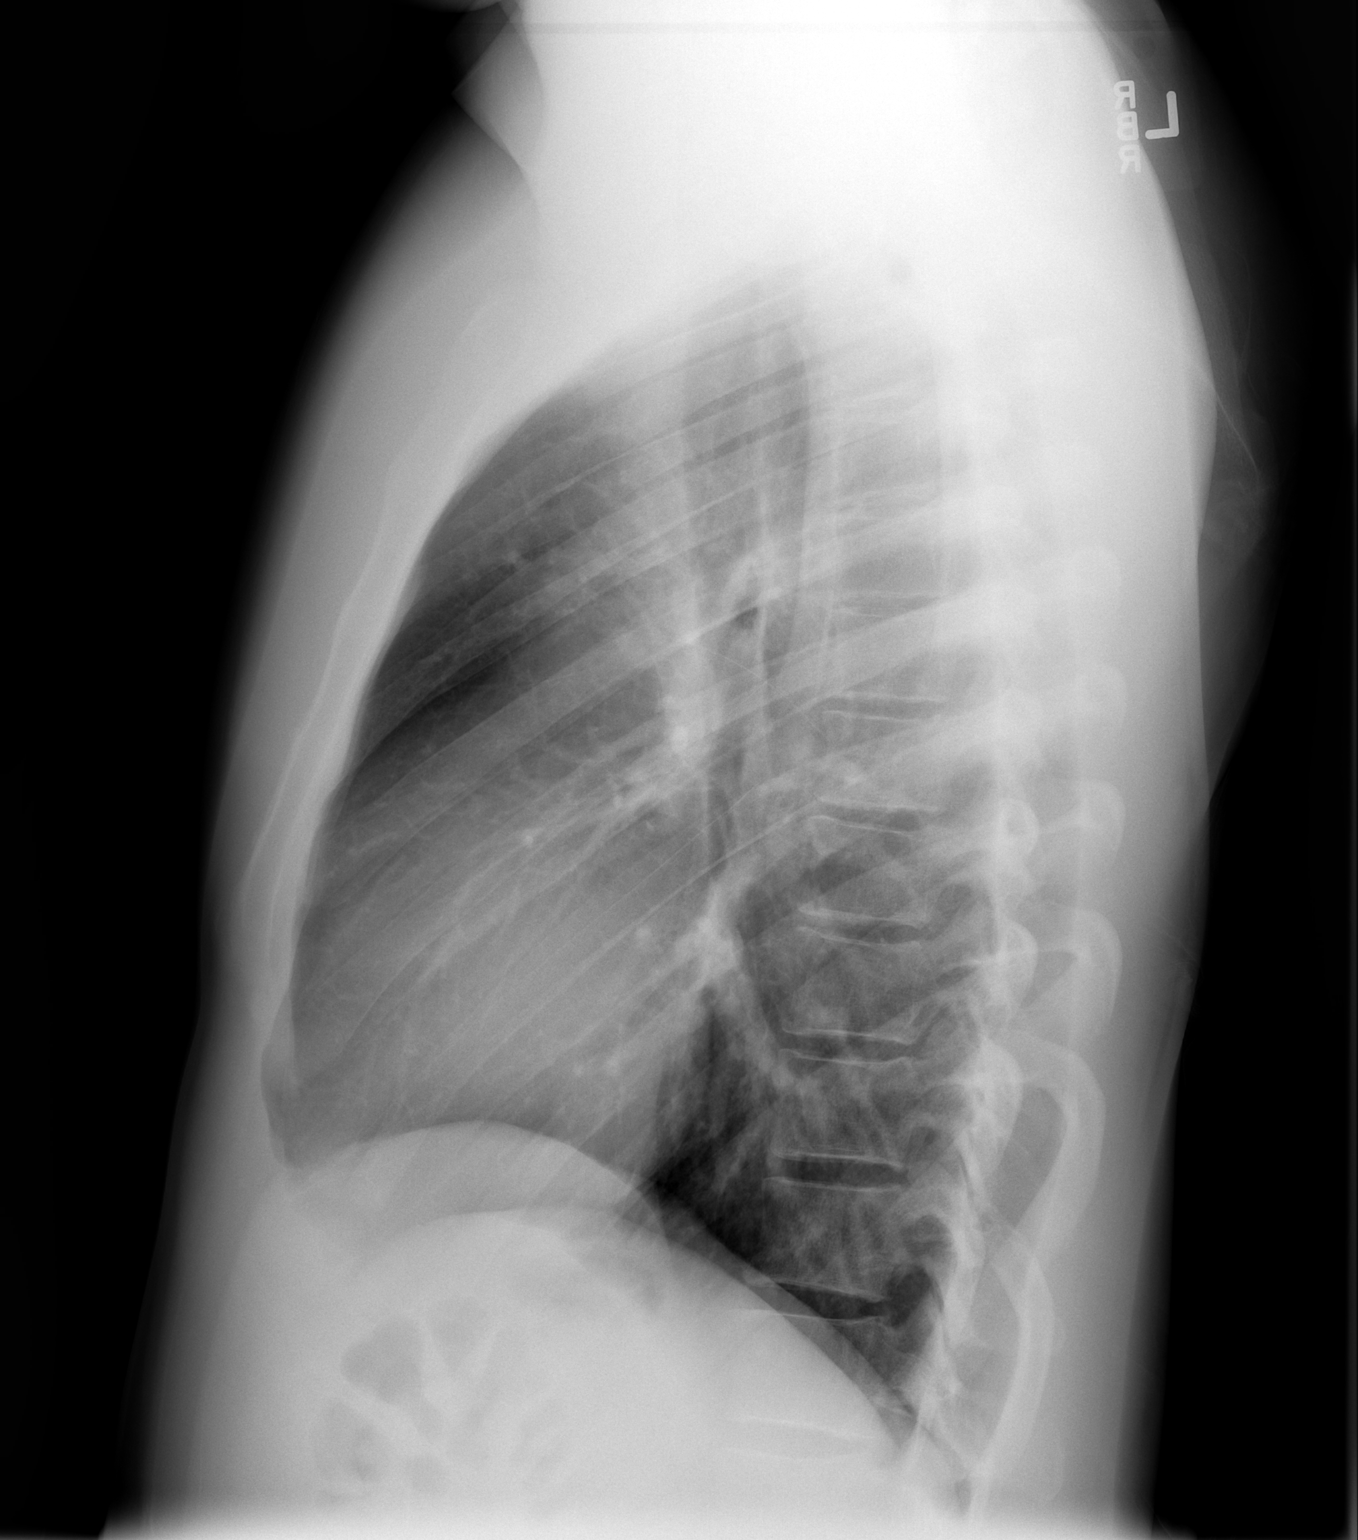

[2 of 2 positions shown; findings below may reference images not displayed]

FINDINGS: Heart and mediastinal contours are within normal limits.
No focal opacities or effusions.  No acute bony abnormality.
IMPRESSION: No active cardiopulmonary disease.

## 2019-08-15 ENCOUNTER — Ambulatory Visit: Payer: Self-pay | Attending: Family

## 2019-08-15 DIAGNOSIS — Z23 Encounter for immunization: Secondary | ICD-10-CM | POA: Insufficient documentation

## 2019-08-15 NOTE — Progress Notes (Signed)
   Covid-19 Vaccination Clinic  Name:  Troy Erickson    MRN: [id number] DOB: 1981-02-28  08/15/2019  [name] was observed post Covid-19 immunization for 15 minutes without incidence. He was provided with Vaccine Information Sheet and instruction to access the V-Safe system.   [name] was instructed to call 911 with any severe reactions post vaccine: Marland Kitchen Difficulty breathing  . Swelling of your face and throat  . A fast heartbeat  . A bad rash all over your body  . Dizziness and weakness    Immunizations Administered    Name Date Dose VIS Date Route   Moderna COVID-19 Vaccine 08/15/2019  3:05 PM 0.5 mL 05/21/2019 Intramuscular   Manufacturer: Moderna   Lot: [id number]   NDC: 47185-501-58

## 2019-09-17 ENCOUNTER — Ambulatory Visit: Payer: Self-pay | Attending: Internal Medicine

## 2022-12-06 ENCOUNTER — Encounter (HOSPITAL_BASED_OUTPATIENT_CLINIC_OR_DEPARTMENT_OTHER): Payer: Self-pay | Admitting: Urology

## 2022-12-06 ENCOUNTER — Emergency Department (HOSPITAL_BASED_OUTPATIENT_CLINIC_OR_DEPARTMENT_OTHER)
Admission: EM | Admit: 2022-12-06 | Discharge: 2022-12-06 | Disposition: A | Discharge status: Home / Self Care | Payer: BC Managed Care – PPO | Attending: Emergency Medicine | Admitting: Emergency Medicine

## 2022-12-06 ENCOUNTER — Other Ambulatory Visit: Payer: Self-pay

## 2022-12-06 ENCOUNTER — Emergency Department (HOSPITAL_BASED_OUTPATIENT_CLINIC_OR_DEPARTMENT_OTHER): Payer: BC Managed Care – PPO

## 2022-12-06 DIAGNOSIS — M545 Low back pain, unspecified: Secondary | ICD-10-CM | POA: Diagnosis present

## 2022-12-06 MED ORDER — METHOCARBAMOL 500 MG PO TABS: 500.0000 mg | ORAL_TABLET | Freq: Every day | ORAL | 0 refills | Status: AC

## 2022-12-06 NOTE — ED Triage Notes (Signed)
Pt states mid lower back pain since Thursday after heavy lifting  Pain worse with bending and light activity   H/o back sx to L2-L3

## 2022-12-06 NOTE — Discharge Instructions (Addendum)
You have been seen in the emergency department for your low back pain.  Your pain is likely due to a muscle sprain which will take time to heal.  It may take a couple of months before you fully feel better.  You may take ibuprofen 400mg  every 6 hours as needed for pain.  You may use a heating pad on the area to help with pain.  Please continue with light activity and refrain from bedrest.  You have been prescribed a muscle relaxant Robaxin (methocarbamol) to relieve your back pain and help you sleep.  This medication may make you drowsy.  Please take this only before bed.   Please keep your appointment with your back specialist and follow-up with them.  Return to the ER should you develop fever, chills, severe worsening of your back pain.

## 2022-12-06 NOTE — ED Provider Notes (Signed)
[facility] [facility] Provider Note   CSN: [id number] Arrival date & time: 12/06/22  1244     History  Chief Complaint  Patient presents with   Back Pain    ZAYDRIAN Erickson is a 42 y.o. male with history of sciatica and spinal surgery who presents with bilateral low back pain for 5 days.  States he was lifting a heavy object out of his car and after this began to have low back pain. He reports his pain and range of motion have been improving since the initial injury.  He has been taking ibuprofen for his pain which helps.  Denies any pain that travels down his legs, loss of bowel or bladder control, fever, chills, malignancy, IV drug use.  Back Pain Associated symptoms: no fever        Home Medications Prior to Admission medications   Medication Sig Start Date End Date Taking? Authorizing Provider  methocarbamol (ROBAXIN) 500 MG tablet Take 1 tablet (500 mg total) by mouth at bedtime for 7 days. 12/06/22 12/13/22 Yes Arabella Merles, PA-C  albuterol (PROVENTIL HFA;VENTOLIN HFA) 108 (90 BASE) MCG/ACT inhaler Inhale 1-2 puffs into the lungs every 6 (six) hours as needed for wheezing. 10/05/11 10/04/12  Reuben Likes, MD      Allergies    Patient has no known allergies.    Review of Systems   Review of Systems  Constitutional:  Negative for chills and fever.  Genitourinary:  Negative for difficulty urinating.  Musculoskeletal:  Positive for back pain.    Physical Exam Updated Vital Signs BP (!) 125/102 (BP Location: Left Arm)   Pulse 70   Temp 97.8 F (36.6 C)   Resp 18   Ht 6' (1.829 m)   Wt 95.3 kg   SpO2 100%   BMI 28.48 kg/m  Physical Exam Vitals and nursing note reviewed.  Constitutional:      General: He is not in acute distress.    Appearance: Normal appearance.  Cardiovascular:     Rate and Rhythm: Normal rate and regular rhythm.  Pulmonary:     Effort: Pulmonary effort is normal.  Musculoskeletal:     Comments: No  spinous processes tenderness. Mild tenderness along the lower paraspinal muscles Decreased back flexion due to pain  Skin:    Comments: No rashes on the back or abdomen Surgical scar noted over the lower spinous processes  Neurological:     General: No focal deficit present.     Mental Status: He is alert.     ED Results / Procedures / Treatments   Labs (all labs ordered are listed, but only abnormal results are displayed) Labs Reviewed - No data to display  EKG None  Radiology DG Lumbar Spine Complete  Result Date: 12/06/2022 CLINICAL DATA:  back pain, injury on thursday r/t heavy lifting EXAM: LUMBAR SPINE - COMPLETE 4+ VIEW COMPARISON:  CT 08/30/2005 FINDINGS: No fracture or dislocation. No spondylolisthesis. Mild lumbar dextroscoliosis apex L3 without evident underlying vertebral anomaly. Degenerative disc disease L5-S1 has progressed since prior study. IMPRESSION: 1. No acute findings. 2. Progressive L5-S1 degenerative disc disease. Electronically Signed   By: Corlis Leak M.D.   On: 12/06/2022 13:59    Procedures Procedures    Medications Ordered in ED Medications - No data to display  ED Course/ Medical Decision Making/ A&P  Medical Decision Making Amount and/or Complexity of Data Reviewed Radiology: ordered.  Risk Prescription drug management.   42 y.o. male presents to the ED for concern of low back pain for 5 days  Differential diagnosis includes but is not limited to muscle sprain, vertebral fracture, shingles.  Pain is bilateral with no rash, no concern for shingles at this time.  Patient states he was moving heavy object now with paraspinal muscle tenderness, this is consistent with possible muscle sprain.  X-ray with no findings concerning for vertebral fracture at this time.  ED Course:  Exam consistent with muscle sprain.  Patient reports improvement in pain and range of motion.  Patient has no red flag symptoms.    The  patient was discharged home with instructions to keep his appointment with his back specialist and follow-up with them.  Discussed that he is able to take ibuprofen and Robaxin for pain control.  He understands to take Robaxin only before bedtime as this can make him drowsy.  He may also use heating packs on the area to help with pain.  Discussed that he should continue with light activity and refrain from bedrest that may cause his pain to worsen.    Impression: Bilateral paraspinal muscles strain   Lab Tests: None indicated  Imaging Studies ordered: Lumbar spine x-ray was ordered by the attending physician I independently visualized and interpreted imaging which showed no acute fracture or dislocation I agree with the radiologist interpretation   Cardiac Monitoring: / EKG: Not indicated   Consultations Obtained: None indicated   Co morbidities that complicate the patient evaluation  Previous spinal surgery  Social Determinants of Health:  Unknown            Final Clinical Impression(s) / ED Diagnoses Final diagnoses:  Acute bilateral low back pain without sciatica    Rx / DC Orders ED Discharge Orders          Ordered    methocarbamol (ROBAXIN) 500 MG tablet  Daily at bedtime        12/06/22 1427              Arabella Merles, PA-C 12/06/22 1443    Arby Barrette, MD 12/09/22 1513
